# Patient Record
Sex: Male | Born: 1995 | Race: Black or African American | Hispanic: No | Marital: Single | Smoking: Current every day smoker
Health system: Southern US, Community
[De-identification: ages and names within clinical notes are randomized; demographics above are authoritative.]

## PROBLEM LIST (undated history)

## (undated) ENCOUNTER — Ambulatory Visit (HOSPITAL_COMMUNITY): Admission: EM | Discharge status: Absent without leave | Payer: Self-pay

## (undated) DIAGNOSIS — J45909 Unspecified asthma, uncomplicated: Secondary | ICD-10-CM

---
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
## 2017-01-11 ENCOUNTER — Encounter (HOSPITAL_COMMUNITY): Payer: Self-pay | Admitting: Emergency Medicine

## 2017-01-11 ENCOUNTER — Emergency Department (HOSPITAL_COMMUNITY)
Admission: EM | Admit: 2017-01-11 | Discharge: 2017-01-11 | Disposition: A | Discharge status: Home / Self Care | Payer: Medicaid Other | Attending: Dermatology | Admitting: Dermatology

## 2017-01-11 DIAGNOSIS — Y939 Activity, unspecified: Secondary | ICD-10-CM | POA: Insufficient documentation

## 2017-01-11 DIAGNOSIS — Y9289 Other specified places as the place of occurrence of the external cause: Secondary | ICD-10-CM | POA: Insufficient documentation

## 2017-01-11 DIAGNOSIS — S3992XA Unspecified injury of lower back, initial encounter: Secondary | ICD-10-CM | POA: Insufficient documentation

## 2017-01-11 DIAGNOSIS — Z5321 Procedure and treatment not carried out due to patient leaving prior to being seen by health care provider: Secondary | ICD-10-CM | POA: Insufficient documentation

## 2017-01-11 DIAGNOSIS — S0990XA Unspecified injury of head, initial encounter: Secondary | ICD-10-CM | POA: Insufficient documentation

## 2017-01-11 DIAGNOSIS — F17200 Nicotine dependence, unspecified, uncomplicated: Secondary | ICD-10-CM | POA: Insufficient documentation

## 2017-01-11 DIAGNOSIS — J45909 Unspecified asthma, uncomplicated: Secondary | ICD-10-CM | POA: Insufficient documentation

## 2017-01-11 DIAGNOSIS — W109XXA Fall (on) (from) unspecified stairs and steps, initial encounter: Secondary | ICD-10-CM | POA: Insufficient documentation

## 2017-01-11 DIAGNOSIS — Y999 Unspecified external cause status: Secondary | ICD-10-CM | POA: Insufficient documentation

## 2017-01-11 HISTORY — DX: Unspecified asthma, uncomplicated: J45.909

## 2017-01-11 NOTE — ED Triage Notes (Addendum)
Pt sts fell down stairs hitting head on left side and lower back last night; pt sts unsure if had LOC; hematoma noted to left eye

## 2017-01-11 NOTE — ED Notes (Signed)
Called for vitals and reassessment x 2. No response.

## 2018-01-21 ENCOUNTER — Emergency Department
Admission: EM | Admit: 2018-01-21 | Discharge: 2018-01-21 | Disposition: A | Discharge status: Home / Self Care | Payer: Self-pay | Attending: Emergency Medicine | Admitting: Emergency Medicine

## 2018-01-21 ENCOUNTER — Emergency Department: Payer: Self-pay

## 2018-01-21 ENCOUNTER — Other Ambulatory Visit: Payer: Self-pay

## 2018-01-21 DIAGNOSIS — J40 Bronchitis, not specified as acute or chronic: Secondary | ICD-10-CM | POA: Insufficient documentation

## 2018-01-21 DIAGNOSIS — J111 Influenza due to unidentified influenza virus with other respiratory manifestations: Secondary | ICD-10-CM | POA: Insufficient documentation

## 2018-01-21 DIAGNOSIS — R69 Illness, unspecified: Secondary | ICD-10-CM

## 2018-01-21 DIAGNOSIS — F17200 Nicotine dependence, unspecified, uncomplicated: Secondary | ICD-10-CM | POA: Insufficient documentation

## 2018-01-21 LAB — INFLUENZA PANEL BY PCR (TYPE A & B)
Influenza A By PCR: NEGATIVE
Influenza B By PCR: NEGATIVE

## 2018-01-21 MED ORDER — ALBUTEROL SULFATE HFA 108 (90 BASE) MCG/ACT IN AERS: 2.0000 | INHALATION_SPRAY | RESPIRATORY_TRACT | 0 refills | Status: AC | PRN

## 2018-01-21 MED ORDER — PSEUDOEPH-BROMPHEN-DM 30-2-10 MG/5ML PO SYRP: 10.0000 mL | ORAL_SOLUTION | Freq: Four times a day (QID) | ORAL | 0 refills | Status: AC | PRN

## 2018-01-21 MED ORDER — PREDNISONE 50 MG PO TABS: 50.0000 mg | ORAL_TABLET | Freq: Every day | ORAL | 0 refills | Status: AC

## 2018-01-21 MED ORDER — FLUTICASONE PROPIONATE 50 MCG/ACT NA SUSP: 1.0000 | Freq: Two times a day (BID) | NASAL | 0 refills | Status: AC

## 2018-01-21 NOTE — ED Provider Notes (Signed)
[facility] Emergency Department Provider Note  ____________________________________________  Time seen: Approximately 5:54 PM  I have reviewed the triage vital signs and the nursing notes.   HISTORY  Chief Complaint Generalized Body Aches    HPI Walter Marsh is a 22 y.o. male who presents the emergency department complaining of nasal congestion, cough, body aches, chills, fever.  Symptoms but have been ongoing times 2 days.  Patient did not get the flu shot and has been exposed to his daughter has similar symptoms.  Patient denies any headache, visual changes, chest pain, shortness of breath, abdominal pain, nausea vomiting, diarrhea or constipation.  Patient does smoke and does have a history of asthma.  Patient denies any audible wheezing or frank shortness of breath.  Patient is tried Tylenol and Motrin with no relief.  Patient denies any other complaints.  No other medications prior to arrival.  Past Medical History:  Diagnosis Date  . Asthma     There are no active problems to display for this patient.   History reviewed. No pertinent surgical history.  Prior to Admission medications   Medication Sig Start Date End Date Taking? Authorizing Provider  albuterol (PROVENTIL HFA;VENTOLIN HFA) 108 (90 Base) MCG/ACT inhaler Inhale 2 puffs into the lungs every 4 (four) hours as needed for wheezing or shortness of breath. 01/21/18   Grey Rakestraw, Delorise RoyalsJonathan D, PA-C  brompheniramine-pseudoephedrine-DM 30-2-10 MG/5ML syrup Take 10 mLs by mouth 4 (four) times daily as needed. 01/21/18   Cherene Dobbins, Delorise RoyalsJonathan D, PA-C  fluticasone (FLONASE) 50 MCG/ACT nasal spray Place 1 spray into both nostrils 2 (two) times daily. 01/21/18   Vaniah Chambers, Delorise RoyalsJonathan D, PA-C  predniSONE (DELTASONE) 50 MG tablet Take 1 tablet (50 mg total) by mouth daily with breakfast. 01/21/18   Ashan Cueva, Delorise RoyalsJonathan D, PA-C    Allergies Shellfish allergy  History reviewed. No pertinent family  history.  Social History Social History   Tobacco Use  . Smoking status: Current Every Day Smoker  . Smokeless tobacco: Never Used  Substance Use Topics  . Alcohol use: Yes  . Drug use: No     Review of Systems  Constitutional: Positive fever/chills Eyes: No visual changes. No discharge ENT: Positive for nasal congestion sore throat Cardiovascular: no chest pain. Respiratory: Positive cough. No SOB. Gastrointestinal: No abdominal pain.  No nausea, no vomiting.  No diarrhea.  No constipation. Musculoskeletal: Negative for musculoskeletal pain. Skin: Negative for rash, abrasions, lacerations, ecchymosis. Neurological: Negative for headaches, focal weakness or numbness. 10-point ROS otherwise negative.  ____________________________________________   PHYSICAL EXAM:  VITAL SIGNS: ED Triage Vitals [01/21/18 1651]  Enc Vitals Group     BP (!) 166/89     Pulse Rate 81     Resp 20     Temp 98.4 F (36.9 C)     Temp Source Oral     SpO2 99 %     Weight 223 lb (101.2 kg)     Height 5\' 8"  (1.727 m)     Head Circumference      Peak Flow      Pain Score 9     Pain Loc      Pain Edu?      Excl. in GC?      Constitutional: Alert and oriented. Well appearing and in no acute distress. Eyes: Conjunctivae are normal. PERRL. EOMI. Head: Atraumatic. ENT:      Ears: EACs unremarkable bilaterally.  TMs are mildly bulging bilaterally.      Nose: Minimal to moderate  clear congestion/rhinnorhea.      Mouth/Throat: Mucous membranes are moist.  Pharynx is nonerythematous and nonedematous.  Uvula is midline. Neck: No stridor.  Neck is supple full range of motion.  Negative Kernig's and Brudzinski's. Hematological/Lymphatic/Immunilogical: Diffuse, mobile, nontender anterior cervical lymphadenopathy. Cardiovascular: Normal rate, regular rhythm. Normal S1 and S2.  Good peripheral circulation. Respiratory: Normal respiratory effort without tachypnea or retractions. Lungs with coarse  breath sounds and faint expiratory wheezing to the left lower lung field.  Otherwise, no adventitious lung sounds in the lung fields.Peri Jefferson air entry to the bases with no decreased or absent breath sounds. Gastrointestinal: Bowel sounds 4 quadrants. Soft and nontender to palpation. No guarding or rigidity. No palpable masses. No distention.  Musculoskeletal: Full range of motion to all extremities. No gross deformities appreciated. Neurologic:  Normal speech and language. No gross focal neurologic deficits are appreciated.  Skin:  Skin is warm, dry and intact. No rash noted. Psychiatric: Mood and affect are normal. Speech and behavior are normal. Patient exhibits appropriate insight and judgement.   ____________________________________________   LABS (all labs ordered are listed, but only abnormal results are displayed)  Labs Reviewed  INFLUENZA PANEL BY PCR (TYPE A & B)   ____________________________________________  EKG   ____________________________________________  RADIOLOGY [name], personally viewed and evaluated these images (plain radiographs) as part of my medical decision making, as well as reviewing the written report by the radiologist.  Dg Chest 2 View  Result Date: 01/21/2018 CLINICAL DATA:  Cough and congestion. EXAM: CHEST - 2 VIEW COMPARISON:  None. FINDINGS: The heart size and mediastinal contours are within normal limits. Both lungs are clear. The visualized skeletal structures are unremarkable. IMPRESSION: No active cardiopulmonary disease. Electronically Signed   By: Sherian Rein M.D.   On: 01/21/2018 18:28    ____________________________________________    PROCEDURES  Procedure(s) performed:    Procedures    Medications - No data to display   ____________________________________________   INITIAL IMPRESSION / ASSESSMENT AND PLAN / ED COURSE  Pertinent labs & imaging results that were available during my care of the patient  were reviewed by me and considered in my medical decision making (see chart for details).  Review of the [place] CSRS was performed in accordance of the [facility] prior to dispensing any controlled drugs.  Clinical Course as of Jan 22 2008  Tue Jan 21, 2018  1756 Patient presents the emergency department with flulike symptoms.  Patient states that symptoms have been ongoing times 2 days.  Prior to my assessment of the patient, influenza had returned negative.  On exam, patient has adventitious lung sounds to the left lower lung field.  I suspect viral bronchitis, however with smoking history I will evaluate with chest x-ray to ensure no indication of consolidation or pneumonia.  Differential includes viral URI, influenza, bronchitis, pneumonia.  [JC]    Clinical Course User Index [JC] Shaft Corigliano, Delorise Royals, PA-C    Patient's diagnosis is consistent with viral illness and bronchitis.  Patient presented with influenza-like symptoms.  Initial differential included influenza, viral URI, bronchitis, pneumonia.  Chest x-ray reveals no consolidation concerning for pneumonia.  Patient's physical exam and symptoms are consistent with bronchitis.  No indication for further labs or imaging at this time.. Patient will be discharged home with prescriptions for Flonase, Bromfed, prednisone, albuterol. Patient is to follow up with primary care as needed or otherwise directed. Patient is given ED precautions to return to the ED for any worsening or new  symptoms.     ____________________________________________  FINAL CLINICAL IMPRESSION(S) / ED DIAGNOSES  Final diagnoses:  Influenza-like illness  Bronchitis      NEW MEDICATIONS STARTED DURING THIS VISIT:  ED Discharge Orders        Ordered    fluticasone (FLONASE) 50 MCG/ACT nasal spray  2 times daily     01/21/18 1855    brompheniramine-pseudoephedrine-DM 30-2-10 MG/5ML syrup  4 times daily PRN     01/21/18 1855    albuterol (PROVENTIL HFA;VENTOLIN HFA)  108 (90 Base) MCG/ACT inhaler  Every 4 hours PRN     01/21/18 1855    predniSONE (DELTASONE) 50 MG tablet  Daily with breakfast     01/21/18 1855          This chart was dictated using voice recognition software/Dragon. Despite best efforts to proofread, errors can occur which can change the meaning. Any change was purely unintentional.    Lanette Hampshire 01/21/18 2009    Phineas Semen, MD 01/21/18 2314

## 2018-01-21 NOTE — ED Triage Notes (Signed)
Pt arrives with  Cough, congestion, states fever but didn't check it at home. States body aches and cramps. States cold chills and sweat. Denies N&V&D. Alert, oriented, ambulatory. Appears congested.

## 2018-04-12 ENCOUNTER — Encounter (HOSPITAL_COMMUNITY): Payer: Self-pay

## 2018-04-12 ENCOUNTER — Ambulatory Visit (HOSPITAL_COMMUNITY)
Admission: EM | Admit: 2018-04-12 | Discharge: 2018-04-12 | Disposition: A | Discharge status: Home / Self Care | Payer: Self-pay | Attending: Family | Admitting: Family

## 2018-04-12 DIAGNOSIS — M62838 Other muscle spasm: Secondary | ICD-10-CM

## 2018-04-12 DIAGNOSIS — R197 Diarrhea, unspecified: Secondary | ICD-10-CM

## 2018-04-12 LAB — POCT I-STAT, CHEM 8
BUN: 15 mg/dL (ref 6–20)
CHLORIDE: 104 mmol/L (ref 101–111)
CREATININE: 1 mg/dL (ref 0.61–1.24)
Calcium, Ion: 1.24 mmol/L (ref 1.15–1.40)
GLUCOSE: 85 mg/dL (ref 65–99)
HCT: 43 % (ref 39.0–52.0)
Hemoglobin: 14.6 g/dL (ref 13.0–17.0)
POTASSIUM: 4 mmol/L (ref 3.5–5.1)
Sodium: 140 mmol/L (ref 135–145)
TCO2: 26 mmol/L (ref 22–32)

## 2018-04-12 MED ORDER — CYCLOBENZAPRINE HCL 10 MG PO TABS: ORAL_TABLET | ORAL | 0 refills | Status: DC

## 2018-04-12 MED ORDER — RANITIDINE HCL 150 MG PO TABS: 150.0000 mg | ORAL_TABLET | Freq: Two times a day (BID) | ORAL | 0 refills | Status: AC

## 2018-04-12 NOTE — ED Provider Notes (Signed)
[facility]    CSN: [id number] Arrival date & time: 04/12/18  1610     History   Chief Complaint Chief Complaint  Patient presents with  . Abdominal Pain    HPI Walter Marsh is a 22 y.o. male.   CC: abdominal pain, loose stool x 4 months, unchanged. No abdominal pain right now.   Describes stool as brown in color. No blood. Stomach cramping. No recent antibiotics. Then gets 'over that' and then back will start cramping. Noticed that stress will increase bowel movements.   Tried medication for reflux one month ago after being seen in ED, with relief. Not taking now.  Had been taking advil 2-3 times per day and then 800mg  ibuprofen for 2 months, since stopped.   Avoiding dairy as think has lactose intolerance. Not sure if ever tested for celiac disease.  Notes muscle spasms in abdomen, can occur before or after diarrhea. Increased flatulence. After eats, notes some vomiting.  No foul taste in mouth, burping.  No cough, wheezing, sob, fever.    Flexeril and valium had helped. Ran out of flexeril   No diet changes.   Works at [employer]- on Health visitor on day. 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 No strenous exercise of late.   ED 03/2018 - muscle spasm seen at [facility]; unable to see labs ED 01/2018 - told URI, at that cough; improved with inhaler.    Past Medical History:  Diagnosis Date  . Asthma     There are no active problems to display for this patient.   History reviewed. No pertinent surgical history.     Home Medications    Prior to Admission medications   Medication Sig Start Date End Date Taking? Authorizing Provider  albuterol (PROVENTIL HFA;VENTOLIN HFA) 108 (90 Base) MCG/ACT inhaler Inhale 2 puffs into the lungs every 4 (four) hours as needed for wheezing or shortness of breath. 01/21/18   Cuthriell, Delorise Royals, PA-C  brompheniramine-pseudoephedrine-DM 30-2-10 MG/5ML syrup  Take 10 mLs by mouth 4 (four) times daily as needed. 01/21/18   Cuthriell, Delorise Royals, PA-C  cyclobenzaprine (FLEXERIL) 10 MG tablet Take one half tablet to one tablet twice per day as needed for muscle spasms. 04/12/18   Allegra Grana, FNP  fluticasone (FLONASE) 50 MCG/ACT nasal spray Place 1 spray into both nostrils 2 (two) times daily. 01/21/18   Cuthriell, Delorise Royals, PA-C  predniSONE (DELTASONE) 50 MG tablet Take 1 tablet (50 mg total) by mouth daily with breakfast. 01/21/18   Cuthriell, Delorise Royals, PA-C  ranitidine (ZANTAC) 150 MG tablet Take 1 tablet (150 mg total) by mouth 2 (two) times daily for 7 days. 04/12/18 04/19/18  Allegra Grana, FNP    Family History No family history on file.  Social History Social History   Tobacco Use  . Smoking status: Current Every Day Smoker  . Smokeless tobacco: Never Used  Substance Use Topics  . Alcohol use: Yes  . Drug use: No     Allergies   Shellfish allergy   Review of Systems Review of Systems  Constitutional: Negative for chills and fever.  Respiratory: Negative for cough.   Cardiovascular: Negative for chest pain and palpitations.  Gastrointestinal: Positive for abdominal pain, diarrhea and vomiting. Negative for blood in stool, constipation and nausea.  Genitourinary: Negative for dysuria and hematuria.  Musculoskeletal: Positive for back pain.  Neurological: Negative for numbness.  Physical Exam Triage Vital Signs ED Triage Vitals [04/12/18 1635]  Enc Vitals Group     BP 133/68     Pulse Rate 100     Resp 18     Temp 98 F (36.7 C)     Temp src      SpO2 95 %     Weight      Height      Head Circumference      Peak Flow      Pain Score 0     Pain Loc      Pain Edu?      Excl. in GC?    No data found.  Updated Vital Signs BP 133/68   Pulse 100   Temp 98 F (36.7 C)   Resp 18   SpO2 95%   Visual Acuity Right Eye Distance:   Left Eye Distance:   Bilateral Distance:    Right Eye Near:   Left  Eye Near:    Bilateral Near:     Physical Exam  Constitutional: He appears well-developed and well-nourished.  Cardiovascular: Regular rhythm and normal heart sounds.  Pulmonary/Chest: Effort normal and breath sounds normal. No respiratory distress. He has no wheezes. He has no rhonchi. He has no rales.  Abdominal: There is no CVA tenderness.  No suprapubic tenderness. Obese  Musculoskeletal:       Lumbar back: He exhibits normal range of motion, no tenderness, no swelling, no pain and no spasm.  Full range of motion with flexion, extension, lateral side bends. No pain, numbness, tingling elicited with single leg raise bilaterally. No rash.  Neurological: He is alert.  Skin: Skin is warm and dry.  Psychiatric: He has a normal mood and affect. His speech is normal and behavior is normal.  Vitals reviewed.    UC Treatments / Results  Labs (all labs ordered are listed, but only abnormal results are displayed) Labs Reviewed  POCT I-STAT, CHEM 8    EKG None  Radiology No results found.  Procedures Procedures (including critical care time)  Medications Ordered in UC Medications - No data to display  Initial Impression / Assessment and Plan / UC Course  I have reviewed the triage vital signs and the nursing notes.  Pertinent labs & imaging results that were available during my care of the patient were reviewed by me and considered in my medical decision making (see chart for details).      Final Clinical Impressions(s) / UC Diagnoses   Final diagnoses:  Diarrhea, unspecified type  Muscle spasm  Afebrile patient patient is well-appearing; at time of evaluation, is not having abdominal pain.  Reassured by benign abdominal, low back exam.  Differentials include irritable bowel syndrome, gastritis, muscle spasm.  Advised that he does not a thorough work-up with GI for alternative diagnoses including chronic diarrhea, celiac.  Low clinical suspicion for C. difficile however  pending stool culture to ensure no infectious etiology, blood playing a role. Advised patient to start back on antacids as it helped his symptoms in the past.  Will start Zantac.  Given Flexeril as needed.  Return precautions given. Of note, call patient after he was discharged and notified him myself that labs are normal.  No electrolyte abnormalities.   Discharge Instructions     We will call you with results of your labs today. Please be sure that you bring back stool cultures in the containers that we gave to you to our clinic in the next couple  days.  This will ensure there is no infectious etiology for diarrhea I do suspect acid reflux is playing a role, please stay on the Zantac twice daily.  take this before the 2 largest meals of the day. Also be important to follow-up with gastroenterology for further work-up of chronic diarrhea. take Flexeril as needed.  Plenty of water Do not drive or operate heavy machinery while on muscle relaxant. Please do not drink alcohol. Only take this medication as needed for acute muscle spasm at bedtime. This medication make you feel drowsy so be very careful.  Stop taking if become too drowsy or somnolent as this puts you at risk for falls.  Please return for evaluation with any new or worsening symptoms.     ED Prescriptions    Medication Sig Dispense Auth. Provider   ranitidine (ZANTAC) 150 MG tablet Take 1 tablet (150 mg total) by mouth 2 (two) times daily for 7 days. 14 tablet Allegra GranaArnett, Durwood Dittus G, FNP   cyclobenzaprine (FLEXERIL) 10 MG tablet Take one half tablet to one tablet twice per day as needed for muscle spasms. 20 tablet Allegra GranaArnett, Ayub Kirsh G, FNP     Controlled Substance Prescriptions [place] Controlled Substance Registry consulted? Not Applicable   Allegra Granarnett, Jermani Eberlein G, FNP 04/12/18 1844

## 2018-04-12 NOTE — ED Triage Notes (Signed)
Pt presents with complaints of muscle spasms in his stomach every day for 6 months.

## 2018-04-12 NOTE — Discharge Instructions (Addendum)
We will call you with results of your labs today. Please be sure that you bring back stool cultures in the containers that we gave to you to our clinic in the next couple days.  This will ensure there is no infectious etiology for diarrhea I do suspect acid reflux is playing a role, please stay on the Zantac twice daily.  take this before the 2 largest meals of the day. Also be important to follow-up with gastroenterology for further work-up of chronic diarrhea. take Flexeril as needed.  Plenty of water Do not drive or operate heavy machinery while on muscle relaxant. Please do not drink alcohol. Only take this medication as needed for acute muscle spasm at bedtime. This medication make you feel drowsy so be very careful.  Stop taking if become too drowsy or somnolent as this puts you at risk for falls.  Please return for evaluation with any new or worsening symptoms.

## 2018-05-06 ENCOUNTER — Encounter (HOSPITAL_COMMUNITY): Payer: Self-pay | Admitting: Emergency Medicine

## 2018-05-06 ENCOUNTER — Ambulatory Visit (HOSPITAL_COMMUNITY)
Admission: EM | Admit: 2018-05-06 | Discharge: 2018-05-06 | Disposition: A | Discharge status: Home / Self Care | Payer: Self-pay | Attending: Family Medicine | Admitting: Family Medicine

## 2018-05-06 DIAGNOSIS — K1370 Unspecified lesions of oral mucosa: Secondary | ICD-10-CM

## 2018-05-06 DIAGNOSIS — M62838 Other muscle spasm: Secondary | ICD-10-CM

## 2018-05-06 MED ORDER — MAGIC MOUTHWASH W/LIDOCAINE: 5.0000 mL | Freq: Four times a day (QID) | ORAL | 0 refills | Status: AC | PRN

## 2018-05-06 MED ORDER — CYCLOBENZAPRINE HCL 10 MG PO TABS: 10.0000 mg | ORAL_TABLET | Freq: Two times a day (BID) | ORAL | 0 refills | Status: AC | PRN

## 2018-05-06 NOTE — ED Triage Notes (Signed)
Pt here with mouth pain and muscle spasms

## 2018-05-06 NOTE — Discharge Instructions (Signed)
You may use flexeril as needed to help with pain. This is a muscle relaxer and causes sedation- please use only at bedtime or when you will be home and not have to drive/work  Please use Magic mouthwash as needed 4 times a day for mouth pain.  Please call if there are issues with this prescription.

## 2018-05-07 NOTE — ED Provider Notes (Signed)
[facility]    CSN: [id number] Arrival date & time: 05/06/18  1724     History   Chief Complaint Chief Complaint  Patient presents with  . Dental Pain    appt 1730    HPI Walter Marsh is a 22 y.o. male history of asthma presenting today for evaluation of mouth pain as well as muscle spasms.  Patient states that over the past 3 days he has pain in his gums behind his 2 front teeth.  He states he has a overbite and frequently his bottom teeth hit the top of his roof of the mouth.  He denies any swelling or any fractured teeth.  He denies any eating any hot food or anything that has irritated the revisit with his mouth.  He has tried Tylenol, ibuprofen, Orajel and peroxide without relief.  He also notes that he has had spasms in his back over the past week.  He denies any specific injury.  Denies numbness or tingling/radiation into the legs.  Denies saddle anesthesia.  Denies loss of bowel or bladder control.  He has tried Tylenol and ibuprofen as well as Flexeril.  He states that the Flexeril has helped his symptoms.  HPI  Past Medical History:  Diagnosis Date  . Asthma     There are no active problems to display for this patient.   History reviewed. No pertinent surgical history.     Home Medications    Prior to Admission medications   Medication Sig Start Date End Date Taking? Authorizing Provider  albuterol (PROVENTIL HFA;VENTOLIN HFA) 108 (90 Base) MCG/ACT inhaler Inhale 2 puffs into the lungs every 4 (four) hours as needed for wheezing or shortness of breath. 01/21/18   Cuthriell, Delorise Royals, PA-C  brompheniramine-pseudoephedrine-DM 30-2-10 MG/5ML syrup Take 10 mLs by mouth 4 (four) times daily as needed. 01/21/18   Cuthriell, Delorise Royals, PA-C  cyclobenzaprine (FLEXERIL) 10 MG tablet Take 1 tablet (10 mg total) by mouth 2 (two) times daily as needed for muscle spasms. 05/06/18   Everline Mahaffy C, PA-C  fluticasone (FLONASE) 50 MCG/ACT nasal spray Place 1 spray  into both nostrils 2 (two) times daily. 01/21/18   Cuthriell, Delorise Royals, PA-C  magic mouthwash w/lidocaine SOLN Take 5 mLs by mouth 4 (four) times daily as needed for mouth pain. 05/06/18   Jenea Dake C, PA-C  predniSONE (DELTASONE) 50 MG tablet Take 1 tablet (50 mg total) by mouth daily with breakfast. 01/21/18   Cuthriell, Delorise Royals, PA-C  ranitidine (ZANTAC) 150 MG tablet Take 1 tablet (150 mg total) by mouth 2 (two) times daily for 7 days. 04/12/18 04/19/18  Allegra Grana, FNP    Family History History reviewed. No pertinent family history.  Social History Social History   Tobacco Use  . Smoking status: Current Every Day Smoker  . Smokeless tobacco: Never Used  Substance Use Topics  . Alcohol use: Yes  . Drug use: No     Allergies   Shellfish allergy   Review of Systems Review of Systems  Constitutional: Negative for activity change, chills, diaphoresis and fatigue.  HENT: Positive for mouth sores. Negative for ear pain, sore throat and trouble swallowing.   Eyes: Negative for photophobia and visual disturbance.  Respiratory: Negative for cough, chest tightness and shortness of breath.   Cardiovascular: Negative for chest pain and leg swelling.  Gastrointestinal: Negative for abdominal pain, nausea and vomiting.  Musculoskeletal: Positive for back pain and myalgias. Negative for arthralgias, gait problem, neck pain  and neck stiffness.  Skin: Negative for color change and wound.  Neurological: Negative for dizziness, weakness, light-headedness, numbness and headaches.     Physical Exam Triage Vital Signs ED Triage Vitals  Enc Vitals Group     BP 05/06/18 1735 123/76     Pulse Rate 05/06/18 1735 (!) 119     Resp 05/06/18 1735 18     Temp 05/06/18 1735 98.3 F (36.8 C)     Temp Source 05/06/18 1735 Oral     SpO2 05/06/18 1735 97 %     Weight --      Height --      Head Circumference --      Peak Flow --      Pain Score 05/06/18 1736 8     Pain Loc --       Pain Edu? --      Excl. in GC? --    No data found.  Updated Vital Signs BP 123/76 (BP Location: Left Arm)   Pulse (!) 119   Temp 98.3 F (36.8 C) (Oral)   Resp 18   SpO2 97%   Visual Acuity Right Eye Distance:   Left Eye Distance:   Bilateral Distance:    Right Eye Near:   Left Eye Near:    Bilateral Near:     Physical Exam  Constitutional: He appears well-developed and well-nourished.  HENT:  Head: Normocephalic and atraumatic.  Hard palate of mouth near anterior portion behind to front teeth with slight white discoloration, appears irritated.  No erythema.  Tenderness to palpation.  Dentition appears normal, no signs of fractured tooth.  No obvious swelling.  Eyes: Conjunctivae are normal.  Neck: Neck supple.  Cardiovascular: Normal rate and regular rhythm.  No murmur heard. Pulmonary/Chest: Effort normal and breath sounds normal. No respiratory distress.  Abdominal: Soft. There is no tenderness.  Musculoskeletal: He exhibits no edema.  Nontender to palpation of cervical, thoracic, lumbar spine midline, tenderness to palpation over the lateral lumbar musculature, full active range of motion of back.  Negative straight leg raise.  Neurological: He is alert.  Skin: Skin is warm and dry.  Psychiatric: He has a normal mood and affect.  Nursing note and vitals reviewed.    UC Treatments / Results  Labs (all labs ordered are listed, but only abnormal results are displayed) Labs Reviewed - No data to display  EKG None  Radiology No results found.  Procedures Procedures (including critical care time)  Medications Ordered in UC Medications - No data to display  Initial Impression / Assessment and Plan / UC Course  I have reviewed the triage vital signs and the nursing notes.  Pertinent labs & imaging results that were available during my care of the patient were reviewed by me and considered in my medical decision making (see chart for details).     Patient  with muscle spasm/muscular strain of lumbar region.  Imaging deferred given no midline tenderness.  Will provide Flexeril to use as needed, discussed sedation regarding Flexeril advised only to use been at home or at bedtime.  Do not drive after use.  Mouth lesion appears more irritation of the mucosal surface versus infection.  No obvious swelling or dental issue.  Will provide patient with Magic mouthwash to use with lidocaine and continue to monitor.  Discussed strict return precautions. Patient verbalized understanding and is agreeable with plan.  Final Clinical Impressions(s) / UC Diagnoses   Final diagnoses:  Mouth lesion  Muscle spasm  Discharge Instructions     You may use flexeril as needed to help with pain. This is a muscle relaxer and causes sedation- please use only at bedtime or when you will be home and not have to drive/work  Please use Magic mouthwash as needed 4 times a day for mouth pain.  Please call if there are issues with this prescription.   ED Prescriptions    Medication Sig Dispense Auth. Provider   cyclobenzaprine (FLEXERIL) 10 MG tablet Take 1 tablet (10 mg total) by mouth 2 (two) times daily as needed for muscle spasms. 20 tablet Esco Joslyn C, PA-C   magic mouthwash w/lidocaine SOLN Take 5 mLs by mouth 4 (four) times daily as needed for mouth pain. 100 mL Locke Barrell C, PA-C     Controlled Substance Prescriptions [place] Controlled Substance Registry consulted? Not Applicable   Lew Dawes, [place] 05/07/18 [phone]

## 2018-08-21 IMAGING — CR DG CHEST 2V
1 series · 2 of 2 positions shown · non-contrast
Comparison: None.

CLINICAL DATA: Cough and congestion.

EXAM:
CHEST - 2 VIEW

[Series 1: dg chest 2 view · 0.14mm/px · 2 of 2 slices shown]
[im 1/2]
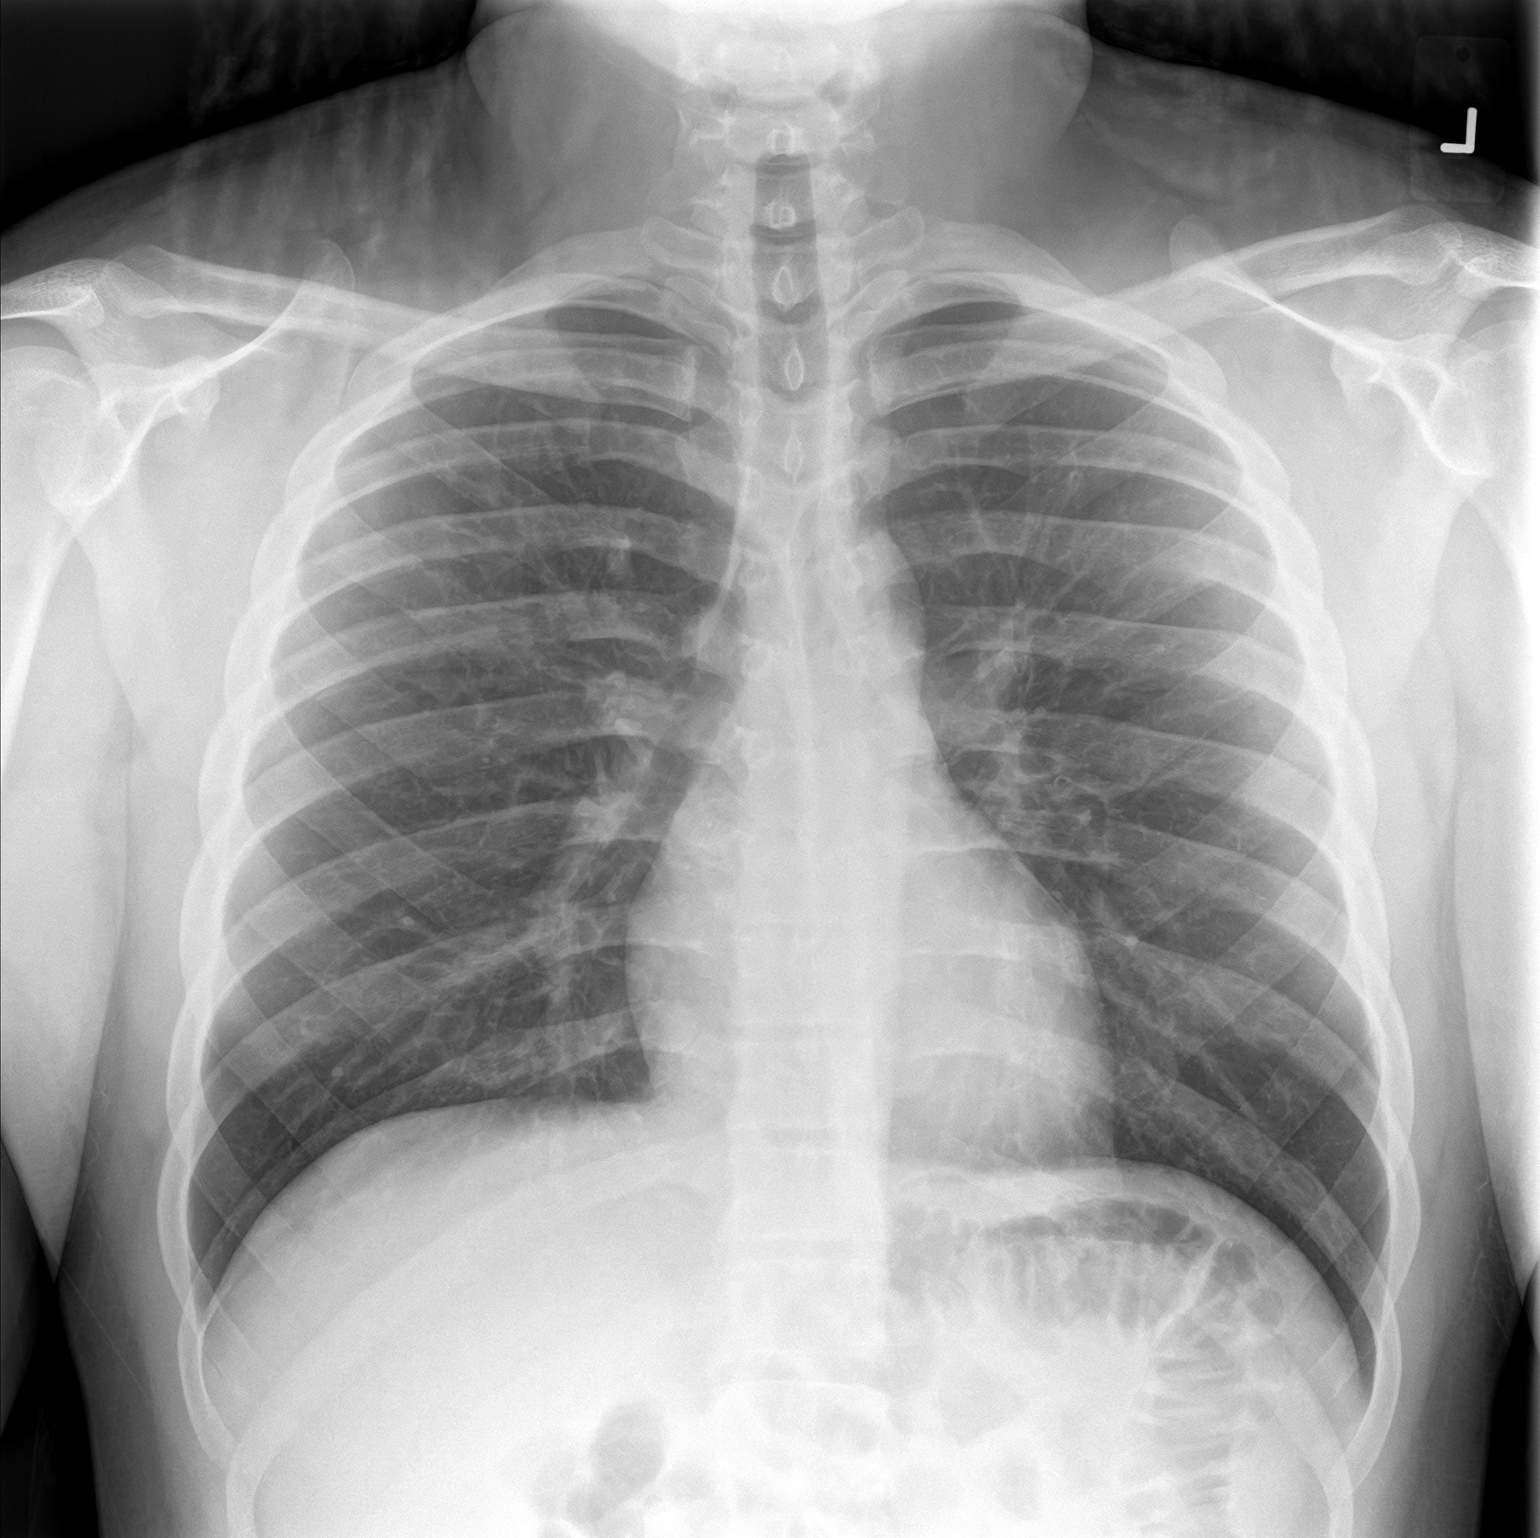
[im 2/2]
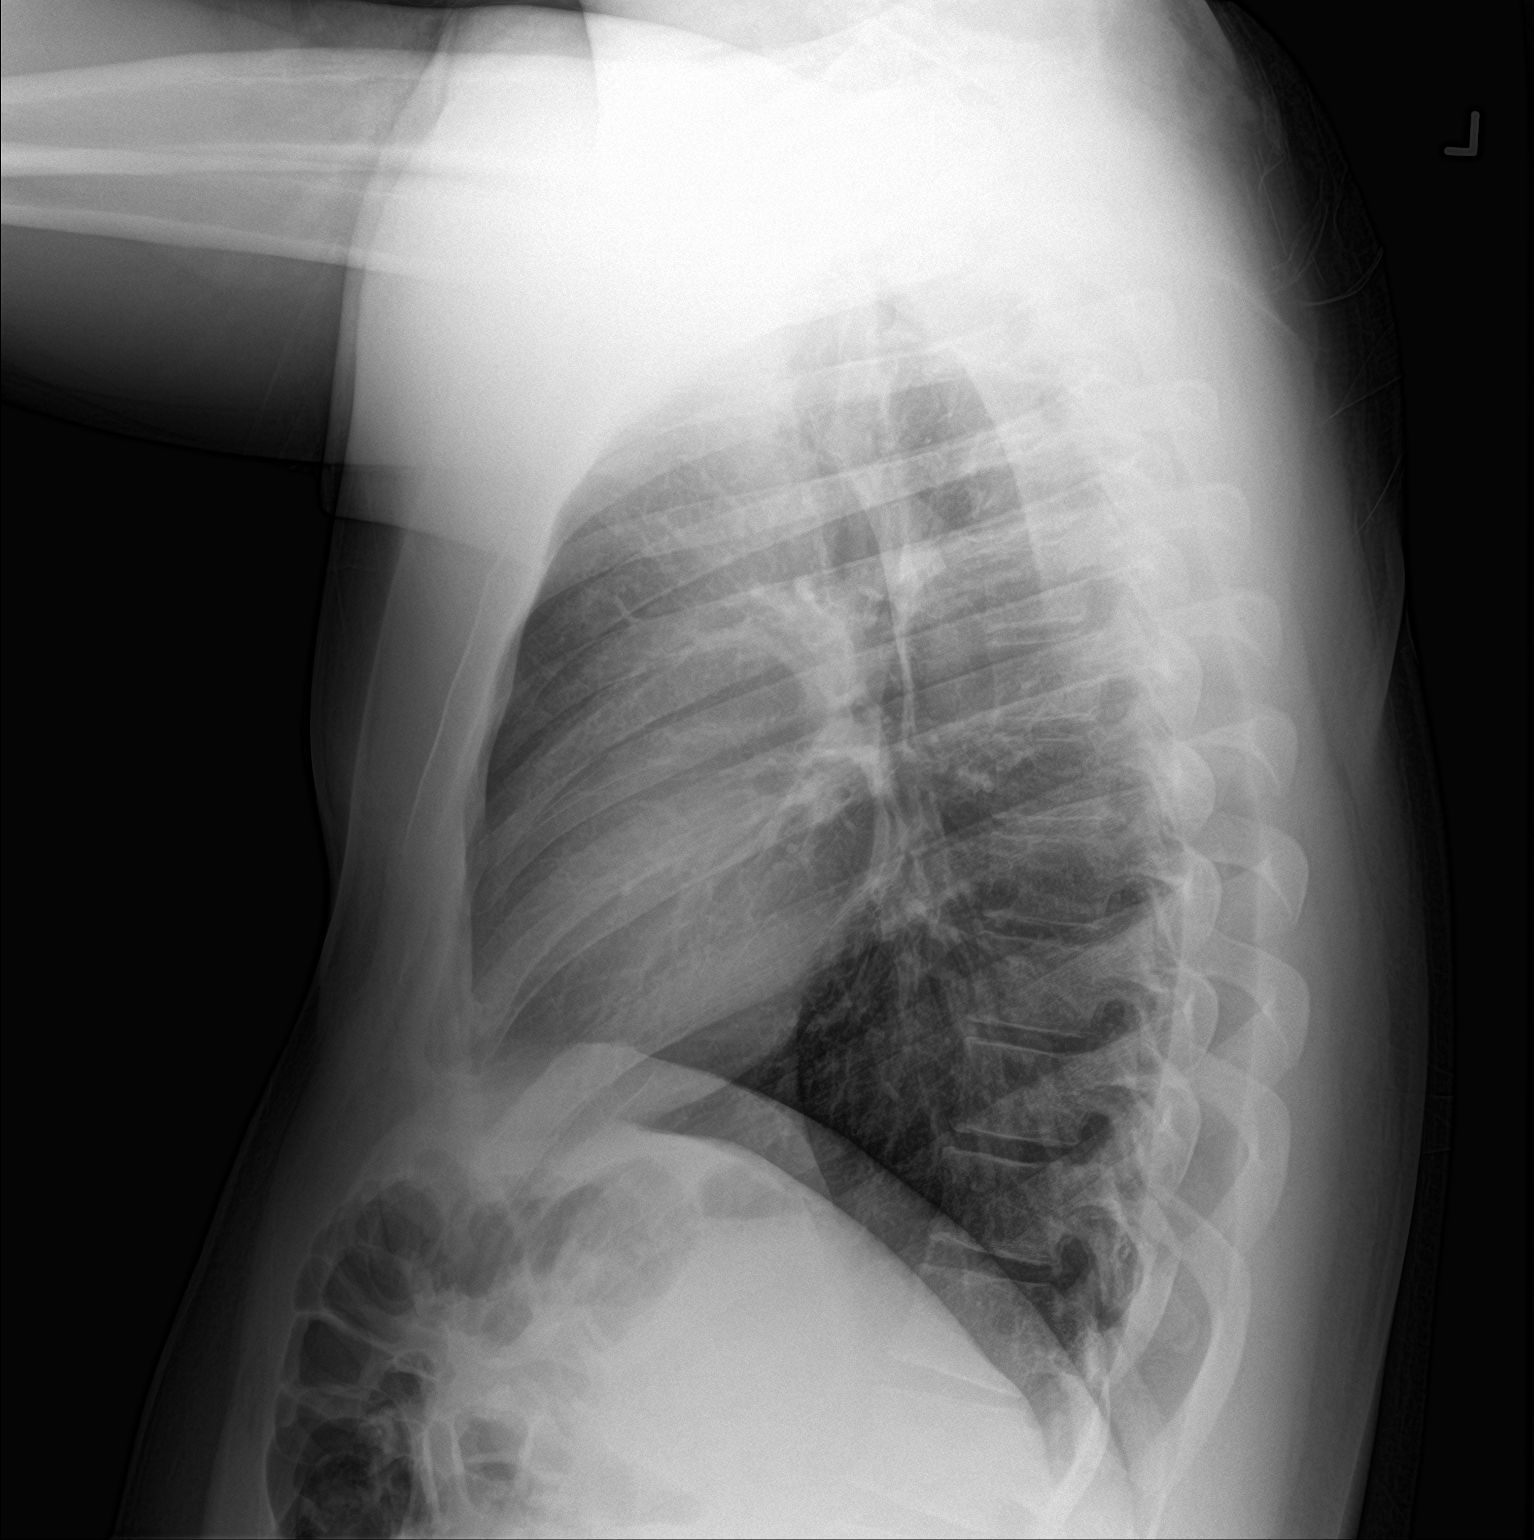

[2 of 2 positions shown; findings below may reference images not displayed]

FINDINGS: The heart size and mediastinal contours are within normal limits.
Both lungs are clear. The visualized skeletal structures are
unremarkable.
IMPRESSION: No active cardiopulmonary disease.
